# Patient Record
Sex: Male | Born: 1995 | Race: White | Hispanic: Yes | Marital: Single
Health system: Southern US, Community
[De-identification: ages and names within clinical notes are randomized; demographics above are authoritative.]

---
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
## 2017-09-19 ENCOUNTER — Emergency Department (HOSPITAL_BASED_OUTPATIENT_CLINIC_OR_DEPARTMENT_OTHER): Payer: BLUE CROSS/BLUE SHIELD

## 2017-09-19 ENCOUNTER — Emergency Department (HOSPITAL_BASED_OUTPATIENT_CLINIC_OR_DEPARTMENT_OTHER)
Admission: EM | Admit: 2017-09-19 | Discharge: 2017-09-19 | Disposition: A | Discharge status: Home / Self Care | Payer: BLUE CROSS/BLUE SHIELD | Attending: Emergency Medicine | Admitting: Emergency Medicine

## 2017-09-19 ENCOUNTER — Encounter (HOSPITAL_BASED_OUTPATIENT_CLINIC_OR_DEPARTMENT_OTHER): Payer: Self-pay | Admitting: Emergency Medicine

## 2017-09-19 DIAGNOSIS — S61411A Laceration without foreign body of right hand, initial encounter: Secondary | ICD-10-CM

## 2017-09-19 DIAGNOSIS — Y929 Unspecified place or not applicable: Secondary | ICD-10-CM | POA: Insufficient documentation

## 2017-09-19 DIAGNOSIS — Y999 Unspecified external cause status: Secondary | ICD-10-CM | POA: Insufficient documentation

## 2017-09-19 DIAGNOSIS — S61216A Laceration without foreign body of right little finger without damage to nail, initial encounter: Secondary | ICD-10-CM | POA: Insufficient documentation

## 2017-09-19 DIAGNOSIS — Y9389 Activity, other specified: Secondary | ICD-10-CM | POA: Insufficient documentation

## 2017-09-19 DIAGNOSIS — W228XXA Striking against or struck by other objects, initial encounter: Secondary | ICD-10-CM | POA: Diagnosis not present

## 2017-09-19 MED ORDER — BUPIVACAINE HCL (PF) 0.5 % IJ SOLN
30.0000 mL | Freq: Once | INTRAMUSCULAR | Status: AC
  Administered 2017-09-19: 10 mL
  Filled 2017-09-19: qty 30

## 2017-09-19 MED ORDER — TETANUS-DIPHTH-ACELL PERTUSSIS 5-2.5-18.5 LF-MCG/0.5 IM SUSP
0.5000 mL | Freq: Once | INTRAMUSCULAR | Status: AC
  Administered 2017-09-19: 0.5 mL via INTRAMUSCULAR
  Filled 2017-09-19: qty 0.5

## 2017-09-19 MED ORDER — AMOXICILLIN-POT CLAVULANATE 875-125 MG PO TABS: 1.0000 | ORAL_TABLET | Freq: Two times a day (BID) | ORAL | 0 refills | Status: AC

## 2017-09-19 NOTE — Discharge Instructions (Signed)
As discussed, please keep the area clean and dry. Ibuprofen for pain. Monitor for any signs of infection. Take your antibiotics the next 5 days and if you feel better. Wear your splint for the next week and Follow up with your primary care provider.  Return sooner if you experience increased pain, swelling, redness, warmth to touch, fever, chills, purulence or other concerning symptoms in the meantime.
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
## 2017-09-19 NOTE — ED Provider Notes (Signed)
Pt seen and evaluated. No tendon lac on exam of lac with full rom. Normal strength to extend digit. Irrigated and dressed. Not sutured as wound over 20 hours old. Will place on po antibiotics.   Rolland PorterJames, Kyira Volkert, MD 09/19/17 2300
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
## 2017-09-19 NOTE — ED Notes (Signed)
Family at bedside. 
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
